# Patient Record
Sex: Female | Born: 1985 | Race: Black or African American | Hispanic: No | Marital: Single | Smoking: Former smoker
Health system: Southern US, Community
[De-identification: ages and names within clinical notes are randomized; demographics above are authoritative.]

## PROBLEM LIST (undated history)

## (undated) ENCOUNTER — Inpatient Hospital Stay (HOSPITAL_COMMUNITY): Payer: Self-pay

## (undated) DIAGNOSIS — O139 Gestational [pregnancy-induced] hypertension without significant proteinuria, unspecified trimester: Secondary | ICD-10-CM

## (undated) DIAGNOSIS — B999 Unspecified infectious disease: Secondary | ICD-10-CM

## (undated) DIAGNOSIS — R51 Headache: Secondary | ICD-10-CM

## (undated) DIAGNOSIS — F329 Major depressive disorder, single episode, unspecified: Secondary | ICD-10-CM

## (undated) DIAGNOSIS — F32A Depression, unspecified: Secondary | ICD-10-CM

## (undated) HISTORY — PX: NO PAST SURGERIES: SHX2092

---

## 2013-07-05 ENCOUNTER — Encounter (HOSPITAL_COMMUNITY): Payer: Self-pay

## 2013-07-05 ENCOUNTER — Inpatient Hospital Stay (HOSPITAL_COMMUNITY): Payer: Medicaid Other

## 2013-07-05 ENCOUNTER — Inpatient Hospital Stay (HOSPITAL_COMMUNITY)
Admission: AD | Admit: 2013-07-05 | Discharge: 2013-07-05 | Disposition: A | Discharge status: Home / Self Care | Payer: Medicaid Other | Source: Ambulatory Visit | Attending: Obstetrics & Gynecology | Admitting: Obstetrics & Gynecology

## 2013-07-05 DIAGNOSIS — Z87891 Personal history of nicotine dependence: Secondary | ICD-10-CM | POA: Insufficient documentation

## 2013-07-05 DIAGNOSIS — B9689 Other specified bacterial agents as the cause of diseases classified elsewhere: Secondary | ICD-10-CM | POA: Insufficient documentation

## 2013-07-05 DIAGNOSIS — N760 Acute vaginitis: Secondary | ICD-10-CM | POA: Insufficient documentation

## 2013-07-05 DIAGNOSIS — A499 Bacterial infection, unspecified: Secondary | ICD-10-CM | POA: Insufficient documentation

## 2013-07-05 DIAGNOSIS — O2390 Unspecified genitourinary tract infection in pregnancy, unspecified trimester: Secondary | ICD-10-CM | POA: Insufficient documentation

## 2013-07-05 DIAGNOSIS — O26899 Other specified pregnancy related conditions, unspecified trimester: Secondary | ICD-10-CM

## 2013-07-05 DIAGNOSIS — K6289 Other specified diseases of anus and rectum: Secondary | ICD-10-CM | POA: Insufficient documentation

## 2013-07-05 DIAGNOSIS — R109 Unspecified abdominal pain: Secondary | ICD-10-CM

## 2013-07-05 DIAGNOSIS — O98819 Other maternal infectious and parasitic diseases complicating pregnancy, unspecified trimester: Secondary | ICD-10-CM | POA: Insufficient documentation

## 2013-07-05 DIAGNOSIS — A5901 Trichomonal vulvovaginitis: Secondary | ICD-10-CM | POA: Insufficient documentation

## 2013-07-05 LAB — URINE MICROSCOPIC-ADD ON

## 2013-07-05 LAB — URINALYSIS, ROUTINE W REFLEX MICROSCOPIC
BILIRUBIN URINE: NEGATIVE
Glucose, UA: NEGATIVE mg/dL
Ketones, ur: 15 mg/dL — AB
Leukocytes, UA: NEGATIVE
NITRITE: NEGATIVE
PH: 6 (ref 5.0–8.0)
Protein, ur: NEGATIVE mg/dL
Specific Gravity, Urine: 1.025 (ref 1.005–1.030)
Urobilinogen, UA: 0.2 mg/dL (ref 0.0–1.0)

## 2013-07-05 LAB — WET PREP, GENITAL: Yeast Wet Prep HPF POC: NONE SEEN

## 2013-07-05 MED ORDER — METRONIDAZOLE 500 MG PO TABS: 500.0000 mg | ORAL_TABLET | Freq: Two times a day (BID) | ORAL | Status: DC

## 2013-07-05 MED ORDER — FLUCONAZOLE 150 MG PO TABS: ORAL_TABLET | ORAL | Status: DC

## 2013-07-05 NOTE — MAU Provider Note (Signed)
History     CSN: [id number]  Arrival date and time: 07/05/13 1655   First Provider Initiated Contact with Patient 07/05/13 1831      Chief Complaint  Patient presents with  . Rectal Pain  . Vaginal Discharge   HPI  Pt is [id number] 1423w6d pregnant with LMP 03/02/2013 [place]  Pt has not received prenatal care. Pt was initally  Planning to terminate the pregnancy the pregnancy.  Pt is now feeling like she wants to have the baby and wants To find out if baby is OK>  Pt c/o of vaginal discharge with burning and itching for 2 to 3 days with blood tinged discharge.  Pt has not been sexually active since Dec when she got pregnant Pt has some rectal pain. Pt denies vaginal bleeding.  Pt denies constipation or diarrhea.   History reviewed. No pertinent past medical history.  History reviewed. No pertinent past surgical history.  History reviewed. No pertinent family history.  History  Substance Use Topics  . Smoking status: Former Smoker    Quit date: 06/14/2013  . Smokeless tobacco: Never Used  . Alcohol Use: No    Allergies: No Known Allergies  Prescriptions prior to admission  Medication Sig Dispense Refill  . aspirin-acetaminophen-caffeine (EXCEDRIN MIGRAINE) 250-250-65 MG per tablet Take 2 tablets by mouth daily as needed for headache.      . naphazoline-glycerin (CLEAR EYES) 0.012-0.2 % SOLN Place 2 drops into both eyes daily as needed for irritation.        Review of Systems  Constitutional: Negative for fever and chills.  HENT:       In mornings -feels light headed  Gastrointestinal: Positive for abdominal pain. Negative for nausea, diarrhea and constipation.  Genitourinary: Negative for dysuria.  Musculoskeletal: Positive for back pain.  Neurological: Positive for headaches.   Physical Exam   Blood pressure 121/67, pulse 76, temperature 98.7 F (37.1 C), resp. rate 16, height 5\' 4"  (1.626 m), weight 58.423 kg (128 lb 12.8 oz), last  menstrual period 03/02/2013, SpO2 97.00%.  Physical Exam  Nursing note and vitals reviewed. Constitutional: She is oriented to person, place, and time. She appears well-developed and well-nourished.  HENT:  Head: Normocephalic.  Eyes: Pupils are equal, round, and reactive to light.  Neck: Normal range of motion. Neck supple.  Cardiovascular: Normal rate.   Respiratory: Effort normal.  GI: Soft.  +FHT with doppler  Genitourinary:  Reddened mucosa, mod-large amount of frothy white/yellow discharge in vaults with small amounts of clumpy white discharge cervix closed, NT Uterus gravid, NT ~17 week size + FHT with doppler  Musculoskeletal: Normal range of motion.  Neurological: She is alert and oriented to person, place, and time.  Skin: Skin is warm and dry.  Psychiatric: She has a normal mood and affect.    MAU Course  Procedures Results for orders placed during the hospital encounter of 07/05/13 (from the past 24 hour(s))  URINALYSIS, ROUTINE W REFLEX MICROSCOPIC     Status: Abnormal   Collection Time    07/05/13  5:40 PM      Result Value Ref Range   Color, Urine YELLOW  YELLOW   APPearance CLEAR  CLEAR   Specific Gravity, Urine 1.025  1.005 - 1.030   pH 6.0  5.0 - 8.0   Glucose, UA NEGATIVE  NEGATIVE mg/dL   Hgb urine dipstick TRACE (*) NEGATIVE   Bilirubin Urine NEGATIVE  NEGATIVE   Ketones, ur 15 (*) NEGATIVE mg/dL  Protein, ur NEGATIVE  NEGATIVE mg/dL   Urobilinogen, UA 0.2  0.0 - 1.0 mg/dL   Nitrite NEGATIVE  NEGATIVE   Leukocytes, UA NEGATIVE  NEGATIVE  URINE MICROSCOPIC-ADD ON     Status: Abnormal   Collection Time    07/05/13  5:40 PM      Result Value Ref Range   Squamous Epithelial / LPF FEW (*) RARE   WBC, UA 0-2  <3 WBC/hpf   RBC / HPF 0-2  <3 RBC/hpf   Urine-Other TRICHOMONAS PRESENT     Results for orders placed during the hospital encounter of 07/05/13 (from the past 24 hour(s))  URINALYSIS, ROUTINE W REFLEX MICROSCOPIC     Status: Abnormal    Collection Time    07/05/13  5:40 PM      Result Value Ref Range   Color, Urine YELLOW  YELLOW   APPearance CLEAR  CLEAR   Specific Gravity, Urine 1.025  1.005 - 1.030   pH 6.0  5.0 - 8.0   Glucose, UA NEGATIVE  NEGATIVE mg/dL   Hgb urine dipstick TRACE (*) NEGATIVE   Bilirubin Urine NEGATIVE  NEGATIVE   Ketones, ur 15 (*) NEGATIVE mg/dL   Protein, ur NEGATIVE  NEGATIVE mg/dL   Urobilinogen, UA 0.2  0.0 - 1.0 mg/dL   Nitrite NEGATIVE  NEGATIVE   Leukocytes, UA NEGATIVE  NEGATIVE  URINE MICROSCOPIC-ADD ON     Status: Abnormal   Collection Time    07/05/13  5:40 PM      Result Value Ref Range   Squamous Epithelial / LPF FEW (*) RARE   WBC, UA 0-2  <3 WBC/hpf   RBC / HPF 0-2  <3 RBC/hpf   Urine-Other TRICHOMONAS PRESENT    WET PREP, GENITAL     Status: Abnormal   Collection Time    07/05/13  7:26 PM      Result Value Ref Range   Yeast Wet Prep HPF POC NONE SEEN  NONE SEEN   Trich, Wet Prep MANY (*) NONE SEEN   Clue Cells Wet Prep HPF POC FEW (*) NONE SEEN   WBC, Wet Prep HPF POC FEW (*) NONE SEEN   Pt's preliminary ultrasound report showed single living IUP 1032w4d pregnant Assessment and Plan  7032w4d pregnant- late to prenatal care [facility] Trichomonas vaginitis and BV- Flagyl 500mg  BID for 7 days diflcuan 150mg  #2 one now and repeat in 3 days if needed for yeast  Jean RosenthalSusan P Americus Scheurich 07/05/2013, 6:32 PM

## 2013-07-05 NOTE — MAU Note (Signed)
[place] and [place] had [place] care. States she has pain and pressure in the rectum, sometimes has abdominal pain, has a heavy vaginal discharge that sometime has a little spotting with itching. Denies nausea or vomiting.

## 2013-07-06 LAB — GC/CHLAMYDIA PROBE AMP
CT Probe RNA: NEGATIVE
GC PROBE AMP APTIMA: NEGATIVE

## 2013-07-06 NOTE — MAU Provider Note (Signed)
Attestation of Attending Supervision of Advanced Practitioner (PA/CNM/NP): Evaluation and management procedures were performed by the Advanced Practitioner under my supervision and collaboration.  I have reviewed the Advanced Practitioner's note and chart, and I agree with the management and plan.  Blanch Stang S Meshia Rau, MD [facility] Attending 07/06/2013 1:43 AM   

## 2013-08-22 ENCOUNTER — Encounter (HOSPITAL_COMMUNITY): Payer: Self-pay | Admitting: *Deleted

## 2013-08-22 ENCOUNTER — Inpatient Hospital Stay (HOSPITAL_COMMUNITY)
Admission: AD | Admit: 2013-08-22 | Discharge: 2013-08-22 | Disposition: A | Discharge status: Home / Self Care | Payer: Medicaid Other | Source: Ambulatory Visit | Attending: Obstetrics and Gynecology | Admitting: Obstetrics and Gynecology

## 2013-08-22 DIAGNOSIS — R109 Unspecified abdominal pain: Secondary | ICD-10-CM | POA: Insufficient documentation

## 2013-08-22 DIAGNOSIS — Z3490 Encounter for supervision of normal pregnancy, unspecified, unspecified trimester: Secondary | ICD-10-CM

## 2013-08-22 DIAGNOSIS — Z87891 Personal history of nicotine dependence: Secondary | ICD-10-CM | POA: Insufficient documentation

## 2013-08-22 DIAGNOSIS — K6289 Other specified diseases of anus and rectum: Secondary | ICD-10-CM | POA: Insufficient documentation

## 2013-08-22 DIAGNOSIS — O9989 Other specified diseases and conditions complicating pregnancy, childbirth and the puerperium: Principal | ICD-10-CM

## 2013-08-22 DIAGNOSIS — O99891 Other specified diseases and conditions complicating pregnancy: Secondary | ICD-10-CM | POA: Insufficient documentation

## 2013-08-22 DIAGNOSIS — O0930 Supervision of pregnancy with insufficient antenatal care, unspecified trimester: Secondary | ICD-10-CM | POA: Insufficient documentation

## 2013-08-22 HISTORY — DX: Depression, unspecified: F32.A

## 2013-08-22 HISTORY — DX: Major depressive disorder, single episode, unspecified: F32.9

## 2013-08-22 HISTORY — DX: Unspecified infectious disease: B99.9

## 2013-08-22 HISTORY — DX: Gestational (pregnancy-induced) hypertension without significant proteinuria, unspecified trimester: O13.9

## 2013-08-22 HISTORY — DX: Headache: R51

## 2013-08-22 LAB — WET PREP, GENITAL
TRICH WET PREP: NONE SEEN
Yeast Wet Prep HPF POC: NONE SEEN

## 2013-08-22 LAB — URINALYSIS, ROUTINE W REFLEX MICROSCOPIC
Bilirubin Urine: NEGATIVE
Glucose, UA: NEGATIVE mg/dL
Hgb urine dipstick: NEGATIVE
Ketones, ur: NEGATIVE mg/dL
Leukocytes, UA: NEGATIVE
NITRITE: NEGATIVE
PH: 6.5 (ref 5.0–8.0)
Protein, ur: NEGATIVE mg/dL
SPECIFIC GRAVITY, URINE: 1.02 (ref 1.005–1.030)
Urobilinogen, UA: 0.2 mg/dL (ref 0.0–1.0)

## 2013-08-22 NOTE — MAU Provider Note (Signed)
Attestation of Attending Supervision of Advanced Practitioner (CNM/NP): Evaluation and management procedures were performed by the Advanced Practitioner under my supervision and collaboration.  I have reviewed the Advanced Practitioner's note and chart, and I agree with the management and plan.  Aleasha Fregeau 08/22/2013 4:27 PM

## 2013-08-22 NOTE — MAU Provider Note (Signed)
 @MAUPATCONTACT @  Chief Complaint:  Rectal Pain and Vaginal Itching   Linda Beard is  28 y.o. G4P3003 at 6762w5d by 18 week US, presents complaining of Rectal Pain and Vaginal Itching .  She states she is not having regular contractions, but she is feeling intermittent sharp pains like she is "being kicked" in the rectal area and in the anterior lower abdomen she is experiencing intermittent stretching/sharp pains bilaterally. She describbes increased whitish discharge over the past 2 days, looks like her normal discharge but copious. Not using pad but reports underwear is fairly soaked. Associated with none vaginal bleeding, intact membranes, along with active fetal movement.    Treated 06/2013 with Flagyl for (+) trichomonas and clue cells/BV on wet mount when presented to MAU for similar complaints of vaginal discharge. No intercourse since 03/2013.  Obstetrical/Gynecological History: OB History   Grav Para Term Preterm Abortions TAB SAB Ect Mult Living   4 3 3       3      Past Medical History: Past Medical History  Diagnosis Date  . Pregnancy induced hypertension     elevated during labor with 3rd preg  . Headache(784.0)   . Infection     UTI  . Depression     2012- all is well    Past Surgical History: Past Surgical History  Procedure Laterality Date  . No past surgeries      Family History: Family History  Problem Relation Age of Onset  . Hypertension Father   . Hearing loss Neg Hx     Social History: History  Substance Use Topics  . Smoking status: Former Smoker    Quit date: 06/14/2013  . Smokeless tobacco: Never Used  . Alcohol Use: No    Allergies: No Known Allergies  Meds:  Prescriptions prior to admission  Medication Sig Dispense Refill  . acetaminophen (TYLENOL) 500 MG tablet Take 1,000 mg by mouth every 6 (six) hours as needed.      . Multiple Vitamin (MULTIVITAMIN WITH MINERALS) TABS tablet Take 1 tablet by mouth daily.        Review of Systems  -   Review of Systems  Constitutional: Negative for fever, chills,  HENT: Negative for hearing loss, ear pain, nosebleeds, congestion, sore throat, neck pain, tinnitus and ear discharge.   Eyes: Negative for blurred vision, double vision,  Respiratory: Negative for cough, shortness of breath Cardiovascular: Negative for chest pain, palpitation Gastrointestinal: Negative for  heartburn, nausea, vomiting, diarrhea, constipation, blood in stool. Abdominal pain as noted in HPI Genitourinary: Negative for dysuria, urgency, frequency, hematuria and flank pain.  Musculoskeletal: Negative for myalgias, back pain Neurological: Negative for dizziness, focal weakness, loss of consciousness, weakness and headaches.    Physical Exam  Blood pressure 110/71, pulse 81, temperature 98.6 F (37 C), temperature source Oral, resp. rate 16, height 5\' 4"  (1.626 m), last menstrual period 03/02/2013. GENERAL: Well-developed, well-nourished female in no acute distress.  LUNGS: Clear to auscultation bilaterally.  HEART: Regular rate and rhythm. ABDOMEN: Soft, nontender, nondistended, gravid.  EXTREMITIES: Nontender, no edema, 2+ distal pulses. CERVICAL EXAM: Dilatation closed Effacement thick   Station -3 Presentation: unable to determine FHT:  Baseline rate 140 bpm   Variability moderate  Accelerationsnone Decelerations variable x 1 Contractions: none   Labs: Results for orders placed during the hospital encounter of 08/22/13 (from the past 24 hour(s))  URINALYSIS, ROUTINE W REFLEX MICROSCOPIC   Collection Time    08/22/13 11:20 AM  Result Value Ref Range   Color, Urine YELLOW  YELLOW   APPearance CLEAR  CLEAR   Specific Gravity, Urine 1.020  1.005 - 1.030   pH 6.5  5.0 - 8.0   Glucose, UA NEGATIVE  NEGATIVE mg/dL   Hgb urine dipstick NEGATIVE  NEGATIVE   Bilirubin Urine NEGATIVE  NEGATIVE   Ketones, ur NEGATIVE  NEGATIVE mg/dL   Protein, ur NEGATIVE  NEGATIVE mg/dL   Urobilinogen, UA 0.2  0.0 -  1.0 mg/dL   Nitrite NEGATIVE  NEGATIVE   Leukocytes, UA NEGATIVE  NEGATIVE  WET PREP, GENITAL   Collection Time    08/22/13 12:20 PM      Result Value Ref Range   Yeast Wet Prep HPF POC NONE SEEN  NONE SEEN   Trich, Wet Prep NONE SEEN  NONE SEEN   Clue Cells Wet Prep HPF POC FEW (*) NONE SEEN   WBC, Wet Prep HPF POC FEW (*) NONE SEEN   Imaging Studies:  No results found.  Assessment/Plan: Linda Beard is  28 y.o. [phone] at [redacted]w[redacted]d presents with abdominal pain and vaginal discharge.  Abdominal pain - consistent with normal pain of pregnancy. Patient counseled on labor precautions: contractions, LOF. Counseled to seek care asap for decreased/absent fetal movement, vaginal bleeding, other concerns.  Vaginal Discharge - normal discharge of pregnancy vs infectious. Wet prep neg for yeast/trich, + few clue cells. GC/CT cultures obtained, patient aware she can expect call if these are positive.  Pregnancy / scant prenatal care - Pt strongly encouraged to keep her upcoming appointment with the clinic for [facility].   Linda Beard 6/8/20151:12 PM  I have seen and examined this patient and agree with above documentation in the resident's note.   Rulon Abide, M.D. [facility] 08/22/2013 3:07 PM

## 2013-08-22 NOTE — Discharge Instructions (Signed)
Prenatal Care  °WHAT IS PRENATAL CARE?  °Prenatal care means health care during your pregnancy, before your baby is born. It is very important to take care of yourself and your baby during your pregnancy by:  °· Getting early prenatal care. If you know you are pregnant, or think you might be pregnant, call your health care provider as soon as possible. Schedule a visit for a prenatal exam. °· Getting regular prenatal care. Follow your health care provider's schedule for blood and other necessary tests. Do not miss appointments. °· Doing everything you can to keep yourself and your baby healthy during your pregnancy. °· Getting complete care. Prenatal care should include evaluation of the medical, dietary, educational, psychological, and social needs of you and your significant other. The medical and genetic history of your family and the family of your baby's father should be discussed with your health care provider. °· Discussing with your health care provider: °· Prescription, over-the-counter, and herbal medicines that you take. °· Any history of substance abuse, alcohol use, smoking, and illegal drug use. °· Any history of domestic abuse and violence. °· Immunizations you have received. °· Your nutrition and diet. °· The amount of exercise you do. °· Any environmental and occupational hazards to which you are exposed. °· History of sexually transmitted infections for both you and your partner. °· Previous pregnancies you have had. °WHY IS PRENATAL CARE SO IMPORTANT?  °By regularly seeing your health care provider, you help ensure that problems can be identified early so that they can be treated as soon as possible. Other problems might be prevented. Many studies have shown that early and regular prenatal care is important for the health of mothers and their babies.  °HOW CAN I TAKE CARE OF MYSELF WHILE I AM PREGNANT?  °Here are ways to take care of yourself and your baby:  °· Start or continue taking your  multivitamin with 400 micrograms (mcg) of folic acid every day. °· Get early and regular prenatal care. It is very important to see a health care provider during your pregnancy. Your health care provider will check at each visit to make sure that you and the baby are healthy. If there are any problems, action can be taken right away to help you and the baby. °· Eat a healthy diet that includes: °· Fruits. °· Vegetables. °· Foods low in saturated fat. °· Whole grains. °· Calcium-rich foods, such as milk, yogurt, and hard cheeses. °· Drink 6 to 8 glasses of liquids a day. °· Unless your health care provider tells you not to, try to be physically active for 30 minutes, most days of the week. If you are pressed for time, you can get your activity in through 10-minute segments, three times a day. °· Do not smoke, drink alcohol, or use drugs. These can cause long-term damage to your baby. Talk with your health care provider about steps to take to stop smoking. Talk with a member of your faith community, a counselor, a trusted friend, or your health care provider if you are concerned about your alcohol or drug use. °· Ask your health care provider before taking any medicine, even over-the-counter medicines. Some medicines are not safe to take during pregnancy. °· Get plenty of rest and sleep. °· Avoid hot tubs and saunas during pregnancy. °· Do not have X-rays taken unless absolutely necessary and with the recommendation of your health care provider. A lead shield can be placed on your abdomen to protect the   baby when X-rays are taken in other parts of the body. °· Do not empty the cat litter when you are pregnant. It may contain a parasite that causes an infection called toxoplasmosis, which can cause birth defects. Also, use gloves when working in garden areas used by cats. °· Do not eat uncooked or undercooked meats or fish. °· Do not eat soft, mold-ripened cheeses (Brie, Camembert, and chevre) or soft, blue-veined  cheese (Danish blue and Roquefort). °· Stay away from toxic chemicals like: °· Insecticides. °· Solvents (some cleaners or paint thinners). °· Lead. °· Mercury. °· Sexual intercourse may continue until the end of the pregnancy, unless you have a medical problem or there is a problem with the pregnancy and your health care provider tells you not to. °· Do not wear high-heel shoes, especially during the second half of the pregnancy. You can lose your balance and fall. °· Do not take long trips, unless absolutely necessary. Be sure to see your health care provider before going on the trip. °· Do not sit in one position for more than 2 hours when on a trip. °· Take a copy of your medical records when going on a trip. Know where a hospital is located in the city you are visiting, in case of an emergency. °· Most dangerous household products will have pregnancy warnings on their labels. Ask your health care provider about products if you are unsure. °· Limit or eliminate your caffeine intake from coffee, tea, sodas, medicines, and chocolate. °· Many women continue working through pregnancy. Staying active might help you stay healthier. If you have a question about the safety or the hours you work at your particular job, talk with your health care provider. °· Get informed: °· Read books. °· Watch videos. °· Go to childbirth classes for you and your significant other. °· Talk with experienced moms. °· Ask your health care provider about childbirth education classes for you and your partner. Classes can help you and your partner prepare for the birth of your baby. °· Ask about a baby doctor (pediatrician) and methods and pain medicine for labor, delivery, and possible cesarean delivery. °HOW OFTEN SHOULD I SEE MY HEALTH CARE PROVIDER DURING PREGNANCY?  °Your health care provider will give you a schedule for your prenatal visits. You will have visits more often as you get closer to the end of your pregnancy. An average  pregnancy lasts about 40 weeks.  °A typical schedule includes visiting your health care provider:  °· About once each month during your first 6 months of pregnancy. °· Every 2 weeks during the next 2 months. °· Weekly in the last month, until the delivery date. °Your health care provider will probably want to see you more often if: °· You are older than 35 years. °· Your pregnancy is high risk because you have certain health problems or problems with the pregnancy, such as: °· Diabetes. °· High blood pressure. °· The baby is not growing on schedule, according to the dates of the pregnancy. °Your health care provider will do special tests to make sure you and the baby are not having any serious problems. °WHAT HAPPENS DURING PRENATAL VISITS?  °· At your first prenatal visit, your health care provider will do a physical exam and talk to you about your health history and the health history of your partner and your family. Your health care provider will be able to tell you what date to expect your baby to be born on. °·   Your first physical exam will include checks of your blood pressure, measurements of your height and weight, and an exam of your pelvic organs. Your health care provider will do a Pap test if you have not had one recently and will do cultures of your cervix to make sure there is no infection. °· At each prenatal visit, there will be tests of your blood, urine, blood pressure, weight, and checking the progress of the baby. °· At your later prenatal visits, your health care provider will check how you are doing and how the baby is developing. You may have a number of tests done as your pregnancy progresses. °· Ultrasound exams are often used to check on the baby's growth and health. °· You may have more urine and blood tests, as well as special tests, if needed. These may include amniocentesis to examine fluid in the pregnancy sac, stress tests to check how the baby responds to contractions, or a  biophysical profile to measure fetus well-being. Your health care provider will explain the tests and why they are necessary. °· You should discuss with your health care provider your plans to breastfeed or bottle-feed your baby. °· Each visit is also a chance for you to learn about staying healthy during pregnancy and to ask questions. °Document Released: 03/06/2003 Document Revised: 12/22/2012 Document Reviewed: 08/19/2012 °[employer]® Patient Information ©2014 [employer]. ° °

## 2013-08-22 NOTE — MAU Note (Signed)
Vaginal d/c and itching past 2 days.  Has been feeling rectal pressure, tightening in lower abd. Denies bleeding or leaking.  No hx of PTL.

## 2013-08-23 LAB — GC/CHLAMYDIA PROBE AMP
CT Probe RNA: NEGATIVE
GC PROBE AMP APTIMA: NEGATIVE

## 2013-09-20 ENCOUNTER — Encounter: Payer: Medicaid Other | Admitting: Obstetrics and Gynecology

## 2014-01-16 ENCOUNTER — Encounter (HOSPITAL_COMMUNITY): Payer: Self-pay | Admitting: *Deleted

## 2014-05-10 ENCOUNTER — Encounter (HOSPITAL_COMMUNITY): Payer: Self-pay | Admitting: *Deleted
